# Patient Record
Sex: Male | Born: 1960
Health system: Southern US, Community
[De-identification: ages and names within clinical notes are randomized; demographics above are authoritative.]

## PROBLEM LIST (undated history)

## (undated) DIAGNOSIS — F419 Anxiety disorder, unspecified: Secondary | ICD-10-CM

## (undated) HISTORY — DX: Anxiety disorder, unspecified: F41.9

---

## 2007-06-27 ENCOUNTER — Ambulatory Visit: Payer: Self-pay | Admitting: Family Medicine

## 2007-06-27 DIAGNOSIS — F411 Generalized anxiety disorder: Secondary | ICD-10-CM | POA: Insufficient documentation

## 2007-06-27 DIAGNOSIS — J019 Acute sinusitis, unspecified: Secondary | ICD-10-CM

## 2007-06-27 LAB — CONVERTED CEMR LAB: Rapid Strep: NEGATIVE

## 2008-06-26 ENCOUNTER — Ambulatory Visit: Payer: Self-pay | Admitting: Family Medicine

## 2008-06-26 DIAGNOSIS — F329 Major depressive disorder, single episode, unspecified: Secondary | ICD-10-CM | POA: Insufficient documentation

## 2008-07-07 ENCOUNTER — Encounter (INDEPENDENT_AMBULATORY_CARE_PROVIDER_SITE_OTHER): Payer: Self-pay | Admitting: *Deleted

## 2008-07-16 ENCOUNTER — Ambulatory Visit: Payer: Self-pay | Admitting: Family Medicine

## 2008-10-06 ENCOUNTER — Telehealth (INDEPENDENT_AMBULATORY_CARE_PROVIDER_SITE_OTHER): Payer: Self-pay | Admitting: *Deleted

## 2008-10-06 ENCOUNTER — Ambulatory Visit: Payer: Self-pay | Admitting: Family Medicine

## 2008-10-06 ENCOUNTER — Encounter (INDEPENDENT_AMBULATORY_CARE_PROVIDER_SITE_OTHER): Payer: Self-pay | Admitting: *Deleted

## 2008-10-06 DIAGNOSIS — R42 Dizziness and giddiness: Secondary | ICD-10-CM

## 2008-10-09 ENCOUNTER — Telehealth (INDEPENDENT_AMBULATORY_CARE_PROVIDER_SITE_OTHER): Payer: Self-pay | Admitting: *Deleted

## 2012-11-14 ENCOUNTER — Ambulatory Visit (INDEPENDENT_AMBULATORY_CARE_PROVIDER_SITE_OTHER): Payer: Managed Care, Other (non HMO) | Admitting: Internal Medicine

## 2012-11-14 ENCOUNTER — Encounter: Payer: Self-pay | Admitting: Internal Medicine

## 2012-11-14 VITALS — BP 130/78 | HR 87 | Temp 98.1°F | Wt 194.0 lb

## 2012-11-14 DIAGNOSIS — S8990XA Unspecified injury of unspecified lower leg, initial encounter: Secondary | ICD-10-CM

## 2012-11-14 MED ORDER — ALPRAZOLAM 0.25 MG PO TABS: 0.2500 mg | ORAL_TABLET | Freq: Two times a day (BID) | ORAL | Status: DC | PRN

## 2012-11-14 NOTE — Progress Notes (Signed)
  Subjective:    Patient ID: Stephen Henry, male    DOB: 1960-11-24, 52 y.o.   MRN: [id number]  HPI The pain began 11/13/12 in L ankle with associated injury or trigger of misstepping off curb twisting l ankle.He felt a "crack" acutely. It is described as dull -sharp  up to level 7 in am. The pain does not radiates . The discomfort lasts constantly with ambulation. It is exacerbated by prolonged immobilization such as sleeping No associated of redness or  swelling . It is stiff w/o skin color change or  temperature change. The pain was not treated with ice or elevation.    He's under a great deal of stress related to impending divorce from his bride of  3 months.        Review of Systems Constitutional: no fever, chills, sweats Musculoskeletal:no  muscle cramps or pain Neuro: no weakness. Chronic , unrelated LLE numbness and tingling from back issues Heme:no lymphadenopathy; abnormal bruising or bleeding        Objective:   Physical Exam Gen.: Healthy and well-nourished in appearance. Alert, appropriate and cooperative throughout exam.  Musculoskeletal/extremities: There is visible swelling of the left lateral malleolus. There is no change in color or temperature of the overlying skin. There is point tenderness over the mid to lower left lateral malleolus. There is pain with inversion of the left ankle and extension of the foot. Vascular: Dorsalis pedis slightly decreased; posterior tibial pulses are full and equal. Para cost is greatest over the right ankle. Neurologic: Alert and oriented x3. Deep tendon reflexes symmetrical and normal.  Skin: Intact without suspicious lesions or rashes. Psych: Frustrated but normally interactive                                                                                       Assessment & Plan:

## 2012-11-14 NOTE — Patient Instructions (Addendum)
Review and correct the record as indicated. Please share record with all medical staff seen.  

## 2012-11-19 ENCOUNTER — Telehealth: Payer: Self-pay | Admitting: Family Medicine

## 2012-11-19 NOTE — Telephone Encounter (Signed)
Caller: Stephen Henry/Patient; Phone: [phone]; Reason for Call: Patient reports that the Xanax prescription he received is not working to help his anxiety.  He reports that he needs atleast 1mg  instead of 0.  25mg  that was prescribed.  Please call the patient to let him know what he needs to do in order to get a stronger dose of medication.

## 2012-11-19 NOTE — Telephone Encounter (Signed)
I called patient and informed him of MD (Dr.Paz's) response. Patient verbalized understanding  Hopp please advise, patient seen you for anxiety on 11/14/2012

## 2012-11-19 NOTE — Telephone Encounter (Signed)
Spoke with patient to inform him that Dr.Hopper whom he seen for anxiety is out of the office as well as his primary care doctor. Patient requested that I send this to another MD for review, patient states " I am going through a serious freaking divorce and I can not sleep at night" (see OV note dated 11/14/2012).   Please advise if ok to increase to 1 mg

## 2012-11-19 NOTE — Telephone Encounter (Signed)
Patient got 20 tablets 11/15/2011, enough medication for 10 days. I will defer prescription to PCP in the morning.

## 2012-11-20 MED ORDER — ALPRAZOLAM 0.5 MG PO TBDP: 0.5000 mg | ORAL_TABLET | Freq: Two times a day (BID) | ORAL | Status: DC | PRN

## 2012-11-20 NOTE — Telephone Encounter (Signed)
Patient is calling again, stating to please express to Dr. Alwyn Ren that how extremely stressed and freaked out he is due to divorce.  Patient would like xanax request address ASAP please.

## 2012-11-20 NOTE — Telephone Encounter (Signed)
Per Dr. Alwyn Ren, request is being forwarded to Dr. Beverely Low.

## 2012-11-20 NOTE — Telephone Encounter (Signed)
Spoke with the pt and informed him that Dr. Beverely Low stated that we can increase the Xanax to 0.5mg  BID prn.  We have not seen him  In 4 years and do not feel comfortable jumping up to 1mg  dose.  Informed him the it he's this stressed out he needs to see a psychiatrist to help him through this difficult time.  Pt stated that we be okay but it's a money and ins thing b/c he has a high ins deductible and he can't pay it.  Pt stated that increasing to the 0.5mg  will be good.  He stated that he normal take 1 when he's very stress and 1 at bedtime to help him sleep.  Informed the pt that I will call him when the rx is ready and he can come and pick it up.  Pt agreed.//AB/CMA

## 2012-11-20 NOTE — Telephone Encounter (Signed)
Pt can increase to 0.5 mg xanax BID PRN, #30, no refills.  I have not seen him in 4 yrs and do not feel comfortable jumping up to 1mg  dose.  If he's this stressed out he needs to see a psychiatrist to help him through this difficult time- please provide names and #s

## 2012-11-21 ENCOUNTER — Encounter: Payer: Self-pay | Admitting: Family Medicine

## 2012-11-21 ENCOUNTER — Ambulatory Visit (INDEPENDENT_AMBULATORY_CARE_PROVIDER_SITE_OTHER): Payer: Managed Care, Other (non HMO) | Admitting: Family Medicine

## 2012-11-21 VITALS — BP 148/100 | HR 85 | Temp 97.8°F | Ht 69.75 in | Wt 196.8 lb

## 2012-11-21 DIAGNOSIS — F329 Major depressive disorder, single episode, unspecified: Secondary | ICD-10-CM

## 2012-11-21 DIAGNOSIS — R030 Elevated blood-pressure reading, without diagnosis of hypertension: Secondary | ICD-10-CM

## 2012-11-21 DIAGNOSIS — F411 Generalized anxiety disorder: Secondary | ICD-10-CM

## 2012-11-21 MED ORDER — CITALOPRAM HYDROBROMIDE 20 MG PO TABS: 20.0000 mg | ORAL_TABLET | Freq: Every day | ORAL | Status: DC

## 2012-11-21 NOTE — Patient Instructions (Addendum)
Follow up in 1 month to recheck mood Start the Celexa daily Use the Xanax as needed for panicked moments Call and schedule counseling- this will help! Hang in there!!!

## 2012-11-21 NOTE — Progress Notes (Signed)
  Subjective:    Patient ID: Stephen Henry, male    DOB: 1961-04-19, 52 y.o.   MRN: [id number]  HPI Anxiety- got married 4 months ago 'and she decided to just leave'.  Wife is now w/ 'prominent attorney in [place]'.  Has PI following the situation.  'it's just overwhelming'.  Has been dating woman x3 yrs.  Pt now pursuing 'alienation of affection law suit'.  Had panic attack at work yesterday- 'i didn't know where i was, i was shaking, couldn't work'.  Pt is 'buried in debt'.  May have to declare bankruptcy and will lose home.  Pt not sleeping well, having hard time concentrating at work.  Decreased appetite.  Elevated BP- no hx of similar.  No CP, SOB, HAs, visual changes, edema.   Review of Systems For ROS see HPI     Objective:   Physical Exam  Vitals reviewed. Constitutional: He is oriented to person, place, and time. He appears well-developed and well-nourished. He appears distressed (obviously upset).  HENT:  Head: Normocephalic and atraumatic.  Eyes: Conjunctivae and EOM are normal. Pupils are equal, round, and reactive to light.  Neck: Normal range of motion. Neck supple. No thyromegaly present.  Cardiovascular: Normal rate, regular rhythm, normal heart sounds and intact distal pulses.   No murmur heard. Pulmonary/Chest: Effort normal and breath sounds normal. No respiratory distress.  Abdominal: Soft. Bowel sounds are normal. He exhibits no distension.  Musculoskeletal: He exhibits no edema.  Lymphadenopathy:    He has no cervical adenopathy.  Neurological: He is alert and oriented to person, place, and time. No cranial nerve deficit.  Skin: Skin is warm and dry.  Psychiatric:  Anxious, depressed, fighting tears          Assessment & Plan:

## 2012-11-21 NOTE — Assessment & Plan Note (Signed)
New.  No hx of similar.  Would guess this is stress related- pt is very worked up about current situation.  Asymptomatic.  Will follow closely and start meds at future visits if needed.

## 2012-11-21 NOTE — Assessment & Plan Note (Signed)
Deteriorated.  Start xanax as needed.  Daily controller SSRI.  Will follow.

## 2012-11-21 NOTE — Assessment & Plan Note (Signed)
Deteriorated.  Will start daily controller med.  Highly recommended counseling and/or psych.  Pt agreeable.  Will follow.

## 2012-11-25 ENCOUNTER — Telehealth: Payer: Self-pay | Admitting: Family Medicine

## 2012-11-25 DIAGNOSIS — F411 Generalized anxiety disorder: Secondary | ICD-10-CM

## 2012-11-25 DIAGNOSIS — F329 Major depressive disorder, single episode, unspecified: Secondary | ICD-10-CM

## 2012-11-25 MED ORDER — ALPRAZOLAM 0.5 MG PO TBDP: 0.5000 mg | ORAL_TABLET | Freq: Three times a day (TID) | ORAL | Status: DC | PRN

## 2012-11-25 MED ORDER — CITALOPRAM HYDROBROMIDE 20 MG PO TABS: 40.0000 mg | ORAL_TABLET | Freq: Every day | ORAL | Status: DC

## 2012-11-25 NOTE — Telephone Encounter (Signed)
Patient called stating the medication he was given last week is not working and he thinks he needs a higher dosage. CB# [phone]

## 2012-11-25 NOTE — Telephone Encounter (Signed)
Spoke with pt made him aware that Dr. Beverely Low has agreed to give him an additional 60 tablets of xanax to help him get through this rough spot in his life. I strongly advised him to not take more than 3 tablets in a 24 hour time frame as we would not supply any additional meds. I again encouraged pt to continue trying to get appt w/ psych to discuss this situation. Pt verbalized understanding and agreed to plan.

## 2012-11-25 NOTE — Telephone Encounter (Signed)
If pt is already taking 4 pills daily I am not going to increase the dose for fear that he will OD as he has been adjusting his meds as he sees fit.  He can have #60 additional xanax to account for the extra he is taking but I will not change the dose.  I ok'd the increase in the Celexa- this will take a few weeks to reach max efficacy.  He needs to give this some time.  There is no magic wand or pill that can take his stress and anger away and all of this will take time to get better.

## 2012-11-25 NOTE — Telephone Encounter (Signed)
Patient presented to the office very irritated and agitated demanding to know if we were going to increase his depression medication and Xanax. Pt states he has been taking 4 pills daily not the 2 pills daily that was prescribed of the Xanax. MD has increased dose of depression med and this was relayed to the pt when he came in the office. I asked the pt if he called the psych md  As we requested, he stated "they didn't have any freaking appts until May, by then all this will be over." I advised the patient that the MD would have to review the notes and only she could make the determination to increase the dose of the Xanax if she felt it was appropriate. I informed the pt that a staff member would call him back but it would probably be at the end of the day.

## 2012-11-25 NOTE — Telephone Encounter (Signed)
Increase to 40mg  daily- 2 of what he has, please send new script for 40mg  daily, #30, 3 refills

## 2012-12-23 ENCOUNTER — Encounter: Payer: Self-pay | Admitting: Lab

## 2012-12-23 ENCOUNTER — Encounter: Payer: Self-pay | Admitting: Family Medicine

## 2012-12-23 ENCOUNTER — Ambulatory Visit (INDEPENDENT_AMBULATORY_CARE_PROVIDER_SITE_OTHER): Payer: Managed Care, Other (non HMO) | Admitting: Family Medicine

## 2012-12-23 VITALS — BP 130/90 | HR 82 | Temp 98.9°F | Ht 69.75 in | Wt 185.8 lb

## 2012-12-23 DIAGNOSIS — F411 Generalized anxiety disorder: Secondary | ICD-10-CM

## 2012-12-23 MED ORDER — ALPRAZOLAM 0.5 MG PO TABS: 0.5000 mg | ORAL_TABLET | Freq: Three times a day (TID) | ORAL | Status: DC | PRN

## 2012-12-23 MED ORDER — BUSPIRONE HCL 15 MG PO TABS: ORAL_TABLET | ORAL | Status: DC

## 2012-12-23 NOTE — Assessment & Plan Note (Signed)
Severe but unchanged.  No obvious improvement on SSRI.  Is able to sleep when taking alprazolam.  Add Buspar to Celexa and continue xanax prn.  Again encouraged psych but pt declines.  Will follow.

## 2012-12-23 NOTE — Progress Notes (Signed)
  Subjective:    Patient ID: Stephen Henry, male    DOB: 1960-09-07, 52 y.o.   MRN: [id number]  HPI Anxiety/depression- pt is currently in the midst of alienation of affection lawsuit.  Estranged wife filed 'false assault charges' which required 48 hr mandatory jail stay.  Charges were later dismissed.  Pt was also accused of check fraud over [employer].  Has conference call later today to discuss situation.  Will have 'waves' of anxiety- typically worse in AM and PM.  On Celexa 40mg  daily, using Alprazolam prn.  Did not schedule psych appt as directed- 'i don't have the money'.   Review of Systems For ROS see HPI     Objective:   Physical Exam  Vitals reviewed. Constitutional: He is oriented to person, place, and time. He appears well-developed and well-nourished. No distress.  Neurological: He is alert and oriented to person, place, and time.  Skin: Skin is warm and dry.  Psychiatric: His behavior is normal. Judgment and thought content normal.  Angry, anxious          Assessment & Plan:

## 2012-12-23 NOTE — Patient Instructions (Addendum)
Follow up in 1 month Continue the Celexa 40mg  Add the Buspirone twice daily Continue the xanax as needed Call with any questions or concerns Hang in there!

## 2013-01-24 ENCOUNTER — Encounter: Payer: Self-pay | Admitting: Lab

## 2013-01-27 ENCOUNTER — Ambulatory Visit (INDEPENDENT_AMBULATORY_CARE_PROVIDER_SITE_OTHER): Payer: Managed Care, Other (non HMO) | Admitting: Family Medicine

## 2013-01-27 ENCOUNTER — Encounter: Payer: Self-pay | Admitting: Family Medicine

## 2013-01-27 VITALS — BP 130/100 | HR 85 | Temp 98.5°F | Ht 69.75 in | Wt 187.0 lb

## 2013-01-27 DIAGNOSIS — R55 Syncope and collapse: Secondary | ICD-10-CM

## 2013-01-27 DIAGNOSIS — F411 Generalized anxiety disorder: Secondary | ICD-10-CM

## 2013-01-27 MED ORDER — ALPRAZOLAM 0.5 MG PO TABS: 0.5000 mg | ORAL_TABLET | Freq: Three times a day (TID) | ORAL | Status: DC | PRN

## 2013-01-27 MED ORDER — ALPRAZOLAM 1 MG PO TABS: 1.0000 mg | ORAL_TABLET | Freq: Three times a day (TID) | ORAL | Status: DC | PRN

## 2013-01-27 NOTE — Patient Instructions (Addendum)
STOP the Buspar Continue the celexa daily and the xanax as needed If you have another episode of blacking out- call me or go to the ER Call with any questions or concerns Hang in there!!!

## 2013-01-27 NOTE — Progress Notes (Signed)
  Subjective:    Patient ID: Stephen Henry, male    DOB: 01/04/1961, 52 y.o.   MRN: [id number]  HPI Anxiety- pt was started on Buspar at last OV.  Feels medication is 'too strong'.  'those things were putting me to sleep'.  Has weaned down to 1/4 tab in attempts of getting off med.  Syncope- pt reports both events occurred after physical exertion and while very emotionally stressed.  No hx of similar.  1st episode 6-7 weeks ago.  Again happened Thursday night.  Both times woke up after hitting face.  No preceding feeling of dizziness, vision blacking out.  No CP, SOB, HAs, visual changes.   Review of Systems For ROS see HPI     Objective:   Physical Exam  Vitals reviewed. Constitutional: He is oriented to person, place, and time. He appears well-developed and well-nourished. No distress.  HENT:  Head: Normocephalic and atraumatic.  Eyes: Conjunctivae and EOM are normal. Pupils are equal, round, and reactive to light.  Neck: Normal range of motion. Neck supple. No thyromegaly present.  Cardiovascular: Normal rate, regular rhythm, normal heart sounds and intact distal pulses.   No murmur heard. Pulmonary/Chest: Effort normal and breath sounds normal. No respiratory distress.  Abdominal: Soft. Bowel sounds are normal. He exhibits no distension.  Musculoskeletal: He exhibits no edema.  Lymphadenopathy:    He has no cervical adenopathy.  Neurological: He is alert and oriented to person, place, and time. No cranial nerve deficit.  Skin: Skin is warm and dry.  Psychiatric:  Anxious, irritable, angry at times          Assessment & Plan:

## 2013-01-28 DIAGNOSIS — R55 Syncope and collapse: Secondary | ICD-10-CM | POA: Insufficient documentation

## 2013-01-28 NOTE — Assessment & Plan Note (Signed)
Unchanged.  Pt still not doing well but now intolerant to Buspar.  Will continue Celexa and increase alprazolam.  Again, encouraged counseling.  Pt again refused.  Will follow.

## 2013-01-28 NOTE — Assessment & Plan Note (Signed)
New.  Pt refusing labs and EKG.  Refusing all work up- 'my deductible's too high and i can't afford all that'.  Pt attributing his 2 episodes to use of buspar.  Stop med.  If sxs continue, pt to call or go to ER b/c he will then require a work up.  Pt expressed understanding and is in agreement w/ plan.

## 2013-02-20 ENCOUNTER — Telehealth: Payer: Self-pay | Admitting: Family Medicine

## 2013-02-20 DIAGNOSIS — F329 Major depressive disorder, single episode, unspecified: Secondary | ICD-10-CM

## 2013-02-20 MED ORDER — CITALOPRAM HYDROBROMIDE 20 MG PO TABS: 60.0000 mg | ORAL_TABLET | Freq: Every day | ORAL | Status: DC

## 2013-02-20 NOTE — Telephone Encounter (Signed)
Ok for him to take 3 tabs daily but this is the max dose.  He will likely need his prescription changed to indicate the dose change and increased quantity

## 2013-02-20 NOTE — Telephone Encounter (Signed)
Patient Information:  Caller Name: Bard  Phone: [phone]  Patient: Stephen Henry, Stephen Henry  Gender: Male  DOB: 23-Jan-1961  Age: 52 Years  PCP: Sheliah Hatch.  Office Follow Up:  Does the office need to follow up with this patient?: Yes  Instructions For The Office: Please see symptoms regarding pt's sxs and increase in medication and follow up with pt   Symptoms  Reason For Call & Symptoms: Pt is on Celexa 20mg  2 tabs once a day.  Depression isn't better, feeling sad lately.  Pt increased Celexa to 3 tabs once a day on 02/19/13 and wants to make sure this is ok or other recommendations.  Reviewed Health History In EMR: Yes  Reviewed Medications In EMR: Yes  Reviewed Allergies In EMR: Yes  Reviewed Surgeries / Procedures: Yes  Date of Onset of Symptoms: 02/19/2013  Guideline(s) Used:  Depression  Disposition Per Guideline:   Discuss with PCP and Callback by Nurse Today  Reason For Disposition Reached:   Started on anti-depressant medications > 2 weeks ago and not feeling any better  Advice Given:  Call Back If:  You feel like harming yourself  You become worse.  Patient Will Follow Care Advice:  YES

## 2013-02-20 NOTE — Telephone Encounter (Signed)
Discuss with patient, Rx sent. 

## 2013-03-19 ENCOUNTER — Ambulatory Visit: Payer: Managed Care, Other (non HMO) | Admitting: Internal Medicine

## 2013-03-19 ENCOUNTER — Encounter: Payer: Self-pay | Admitting: *Deleted

## 2013-03-20 ENCOUNTER — Ambulatory Visit: Payer: Managed Care, Other (non HMO) | Admitting: Family Medicine

## 2013-03-20 ENCOUNTER — Ambulatory Visit (INDEPENDENT_AMBULATORY_CARE_PROVIDER_SITE_OTHER): Payer: Managed Care, Other (non HMO) | Admitting: Family Medicine

## 2013-03-20 ENCOUNTER — Encounter: Payer: Self-pay | Admitting: Family Medicine

## 2013-03-20 VITALS — BP 138/80 | HR 79 | Temp 98.2°F | Ht 69.75 in | Wt 191.6 lb

## 2013-03-20 DIAGNOSIS — N529 Male erectile dysfunction, unspecified: Secondary | ICD-10-CM

## 2013-03-20 DIAGNOSIS — F411 Generalized anxiety disorder: Secondary | ICD-10-CM

## 2013-03-20 MED ORDER — SILDENAFIL CITRATE 50 MG PO TABS: 50.0000 mg | ORAL_TABLET | Freq: Every day | ORAL | Status: DC | PRN

## 2013-03-20 MED ORDER — ALPRAZOLAM 1 MG PO TABS: 1.0000 mg | ORAL_TABLET | Freq: Three times a day (TID) | ORAL | Status: DC | PRN

## 2013-03-20 NOTE — Progress Notes (Signed)
  Subjective:    Patient ID: Stephen Henry, male    DOB: [birth year], 52 y.o.   MRN: [id number]  HPI Anxiety/depression- needs refill on xanax.  Had trial this AM and has another on the 29th for divorce.  Has reported doing better recently until this round of court cases.  Ex-wife has now filed charges against currently separated wife- ex wife lost case and is now made at pt and 'doesn't ever want to speak to him again'.  Has fired Facilities manager and only received 1/3 of money he paid.  Stressed about this.  Feels that the meds are definitely working but causing some sexual side effects and would like to try Viagra.   Review of Systems For ROS see HPI     Objective:   Physical Exam  Vitals reviewed. Constitutional: He is oriented to person, place, and time. He appears well-developed and well-nourished. No distress.  Neurological: He is alert and oriented to person, place, and time.  Skin: Skin is warm and dry.  Psychiatric: His behavior is normal. Judgment and thought content normal.  Most calm that he has been in recent months          Assessment & Plan:

## 2013-03-20 NOTE — Assessment & Plan Note (Signed)
New.  Suspect this is due to medication side effect from Celexa.  Start Viagra and see if sxs improve.  Pt expressed understanding and is in agreement w/ plan.

## 2013-03-20 NOTE — Assessment & Plan Note (Signed)
Unchanged.  Requires refill on xanax.  Depression/anxiety is actually better than previous.  Will continue to follow.

## 2013-03-20 NOTE — Patient Instructions (Addendum)
Continue the Celexa daily Use the alprazolam as needed for anxiety Keep hanging in there!!!

## 2013-05-14 ENCOUNTER — Telehealth: Payer: Self-pay | Admitting: Family Medicine

## 2013-05-14 NOTE — Telephone Encounter (Signed)
Patient called requesting rx for alprazolam be sent to [facility]. Patient was advised to contact pharmacy for future requests.

## 2013-05-16 NOTE — Telephone Encounter (Signed)
Last OV 03/20/13 Last refill 03/20/13 Contact on file No UDS Please advise.

## 2013-05-19 MED ORDER — ALPRAZOLAM 1 MG PO TABS: 1.0000 mg | ORAL_TABLET | Freq: Three times a day (TID) | ORAL | Status: DC | PRN

## 2013-05-19 NOTE — Telephone Encounter (Signed)
Rx given to [facility] to sign, pt will be notified to pick up rx (needs UDS)

## 2013-05-19 NOTE — Telephone Encounter (Signed)
Ok for #60.  needs to sign agreement and do UDS if not already done

## 2013-06-17 ENCOUNTER — Telehealth: Payer: Self-pay | Admitting: Family Medicine

## 2013-06-17 MED ORDER — TRAZODONE HCL 50 MG PO TABS: 50.0000 mg | ORAL_TABLET | Freq: Every evening | ORAL | Status: DC | PRN

## 2013-06-17 MED ORDER — BUSPIRONE HCL 15 MG PO TABS: 7.5000 mg | ORAL_TABLET | Freq: Two times a day (BID) | ORAL | Status: DC

## 2013-06-17 NOTE — Telephone Encounter (Signed)
Spoke with pt and he would like to know if there is an alternative to the Xanax that is not a controlled substance. States he had been on a "blue pill" in the past that had helped him sleep and controlled his anxiety. Please advise pt states he has not slept in 3 days.

## 2013-06-17 NOTE — Telephone Encounter (Signed)
Per Tabori sent in Buspar 15mg  take 1/2 bid for two weeks and then take 1 tablet by mouth bid. Also sent in rx for trazodone to help pt sleep.

## 2013-06-17 NOTE — Telephone Encounter (Signed)
Pt signed controlled substance agreement and that clearly states he is subject to be tested b/c he is on benzos.  If he is not interested in doing UDS, no additional med refills

## 2013-06-17 NOTE — Telephone Encounter (Signed)
Patient states that he tried picking up anxiety medication in early October but was told that he needed to do a drug screening. He states that he refused to do drug screening and is now out of medication. He would like it if someone would explain to him why exactly he needs to do a drug test?

## 2013-06-17 NOTE — Telephone Encounter (Signed)
Please advise 

## 2013-06-18 NOTE — Telephone Encounter (Signed)
Agree w/ note as documented by CMA- scripts for Trazodone and Buspar sent

## 2013-06-24 ENCOUNTER — Telehealth: Payer: Self-pay | Admitting: Family Medicine

## 2013-06-24 NOTE — Telephone Encounter (Signed)
Ok for xanax refill if pt agrees to do UDS

## 2013-06-24 NOTE — Telephone Encounter (Signed)
Patient states that he has not slept in 4 days and would like to start back using Xanax. Advised patient that he was given an alternative because he refused to do UDS (per last phone note). Patient states that he will come in for UDS if he can pick up Xanax rx because he is unable to function. Please advise.

## 2013-06-25 MED ORDER — ALPRAZOLAM 1 MG PO TABS: 1.0000 mg | ORAL_TABLET | Freq: Three times a day (TID) | ORAL | Status: DC | PRN

## 2013-06-25 NOTE — Telephone Encounter (Signed)
Called pt could not leave a message. Placed up front for pt to complete uds.

## 2013-06-25 NOTE — Telephone Encounter (Signed)
Med filled, placed up front for pick up pt notified.

## 2013-06-27 ENCOUNTER — Ambulatory Visit (INDEPENDENT_AMBULATORY_CARE_PROVIDER_SITE_OTHER): Payer: Managed Care, Other (non HMO) | Admitting: Family Medicine

## 2013-06-27 ENCOUNTER — Encounter: Payer: Self-pay | Admitting: Family Medicine

## 2013-06-27 VITALS — BP 130/86 | HR 81 | Temp 98.5°F | Resp 16 | Wt 189.5 lb

## 2013-06-27 DIAGNOSIS — J069 Acute upper respiratory infection, unspecified: Secondary | ICD-10-CM

## 2013-06-27 MED ORDER — IBUPROFEN 600 MG PO TABS: 600.0000 mg | ORAL_TABLET | Freq: Three times a day (TID) | ORAL | Status: AC | PRN

## 2013-06-27 NOTE — Patient Instructions (Signed)
Follow up as needed Start the ibuprofen 3x/day- take w/ food for fever/inflammation of vocal cords Drink plenty of fluids REST! Start Mucinex D for congestion and drainage If no improvement by next week, or worsening, call for antibiotic Add tylenol as needed for body aches/fever Hang in there!!

## 2013-06-27 NOTE — Assessment & Plan Note (Signed)
New.  No evidence of bacterial infxn.  No need for abx.  Reviewed supportive care and red flags that should prompt return.  Pt expressed understanding and is in agreement w/ plan.  

## 2013-06-27 NOTE — Progress Notes (Signed)
  Subjective:    Patient ID: Stephen Henry, male    DOB: 1961/01/21, 52 y.o.   MRN: [id number]  HPI URI- sxs started 'a couple of days ago' but 'yesterday it got really bad'.  + sinus pain, nasal congestion, PND.  Bilateral ear fullness.  + hoarseness- has completely lost voice.  + dry cough.  + subjective fevers.  No N/V, + diarrhea.  No known sick contacts   Review of Systems For ROS see HPI     Objective:   Physical Exam  Vitals reviewed. Constitutional: He appears well-developed and well-nourished. No distress.  HENT:  Head: Normocephalic and atraumatic.  Right Ear: Tympanic membrane normal.  Left Ear: Tympanic membrane normal.  Nose: Mucosal edema and rhinorrhea present. Right sinus exhibits no maxillary sinus tenderness and no frontal sinus tenderness. Left sinus exhibits no maxillary sinus tenderness and no frontal sinus tenderness.  Mouth/Throat: Mucous membranes are normal. Oropharyngeal exudate (PND) and posterior oropharyngeal erythema present. No posterior oropharyngeal edema.  hoarseness  Eyes: Conjunctivae and EOM are normal. Pupils are equal, round, and reactive to light.  Neck: Normal range of motion. Neck supple.  Cardiovascular: Normal rate, regular rhythm and normal heart sounds.   Pulmonary/Chest: Effort normal and breath sounds normal. No respiratory distress. He has no wheezes.  + cough  Lymphadenopathy:    He has no cervical adenopathy.  Skin: Skin is warm and dry.          Assessment & Plan:

## 2013-08-07 ENCOUNTER — Encounter: Payer: Self-pay | Admitting: Family Medicine

## 2013-08-15 ENCOUNTER — Telehealth: Payer: Self-pay | Admitting: Family Medicine

## 2013-08-15 MED ORDER — ALPRAZOLAM 1 MG PO TABS: 1.0000 mg | ORAL_TABLET | Freq: Three times a day (TID) | ORAL | Status: DC | PRN

## 2013-08-15 NOTE — Telephone Encounter (Signed)
Patient is calling to request a refill on his Xanax rx. He is completely out. Please advise.

## 2013-08-15 NOTE — Telephone Encounter (Signed)
Ok for refill? 

## 2013-08-15 NOTE — Telephone Encounter (Signed)
Med filled.  

## 2013-08-15 NOTE — Telephone Encounter (Signed)
Pt.notified

## 2013-08-15 NOTE — Telephone Encounter (Signed)
Last OV 06-27-13 (URI) Xanax filled 06-25-13 #60 with 0  Low Risk

## 2013-10-27 ENCOUNTER — Telehealth: Payer: Self-pay | Admitting: Family Medicine

## 2013-10-27 MED ORDER — ALPRAZOLAM 1 MG PO TABS: 1.0000 mg | ORAL_TABLET | Freq: Three times a day (TID) | ORAL | Status: DC | PRN

## 2013-10-27 NOTE — Telephone Encounter (Signed)
Med filled.  

## 2013-10-27 NOTE — Telephone Encounter (Signed)
Ok for #60, 1 refill 

## 2013-10-27 NOTE — Telephone Encounter (Signed)
Patient called requesting rx for alprazolam. [facility] [address]

## 2013-10-27 NOTE — Telephone Encounter (Signed)
Last OV 06-27-13 Xanax filled 08-15-13 #60 with 0

## 2014-01-06 ENCOUNTER — Telehealth: Payer: Self-pay | Admitting: Family Medicine

## 2014-01-06 MED ORDER — ALPRAZOLAM 1 MG PO TABS: 1.0000 mg | ORAL_TABLET | Freq: Three times a day (TID) | ORAL | Status: DC | PRN

## 2014-01-06 NOTE — Telephone Encounter (Signed)
Caller name: Molly MaduroRobert Relation to pt: self Call back number: [phone] [id number] [place], [place] - [address]  Reason for call:  Pt needs a refill on RX ALPRAZolam (XANAX) 1 MG tablet .  Pt would like to be contacted when sent to pharmacy.

## 2014-01-06 NOTE — Telephone Encounter (Signed)
Rx printed and placed in Dr. Rennis Goldenabori's red quick signature folder awaiting signature.

## 2014-01-06 NOTE — Telephone Encounter (Signed)
Last OV 09-10-13 Alprazolam last filled 10-27-13 #60 with 1  Low risk

## 2014-01-06 NOTE — Telephone Encounter (Signed)
Ok for #60, 1 refill 

## 2014-01-08 NOTE — Telephone Encounter (Signed)
[facility] [address] by Lorene DyJessica Tyler, CMA.

## 2014-02-10 ENCOUNTER — Encounter: Payer: Self-pay | Admitting: Family Medicine

## 2014-02-10 ENCOUNTER — Ambulatory Visit (INDEPENDENT_AMBULATORY_CARE_PROVIDER_SITE_OTHER): Payer: Managed Care, Other (non HMO) | Admitting: Family Medicine

## 2014-02-10 VITALS — BP 120/68 | HR 86 | Temp 98.0°F | Resp 16 | Wt 192.4 lb

## 2014-02-10 DIAGNOSIS — H8110 Benign paroxysmal vertigo, unspecified ear: Secondary | ICD-10-CM

## 2014-02-10 MED ORDER — MECLIZINE HCL 50 MG PO TABS: 50.0000 mg | ORAL_TABLET | Freq: Three times a day (TID) | ORAL | Status: AC | PRN

## 2014-02-10 NOTE — Assessment & Plan Note (Signed)
New.  Pt is completely asymptomatic today.  Has had 2 episodes- most recent was yesterday w/ turning head to look out window.  Reviewed dx and tx w/ pt- script for meclizine given.  Reviewed supportive care and red flags that should prompt return.  Pt expressed understanding and is in agreement w/ plan.

## 2014-02-10 NOTE — Progress Notes (Signed)
Pre visit review using our clinic review tool, if applicable. No additional management support is needed unless otherwise documented below in the visit note. 
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
## 2014-02-10 NOTE — Patient Instructions (Signed)
Schedule your complete physical at your convenience Use the Meclizine as needed for dizziness Drink plenty of fluids Change positions slowly Call with any questions or concerns Hang in there!!

## 2014-06-10 ENCOUNTER — Other Ambulatory Visit: Payer: Self-pay

## 2014-06-10 MED ORDER — ALPRAZOLAM 1 MG PO TABS: 1.0000 mg | ORAL_TABLET | Freq: Three times a day (TID) | ORAL | Status: DC | PRN

## 2014-06-11 ENCOUNTER — Other Ambulatory Visit: Payer: Self-pay | Admitting: General Practice

## 2014-06-11 MED ORDER — ALPRAZOLAM 1 MG PO TABS: 1.0000 mg | ORAL_TABLET | Freq: Three times a day (TID) | ORAL | Status: DC | PRN

## 2014-06-11 NOTE — Telephone Encounter (Signed)
Last OV 02-10-14 Alprazolam filled 01-06-14 #60 with 1

## 2014-06-11 NOTE — Telephone Encounter (Signed)
Med filled and faxed.  

## 2014-09-29 ENCOUNTER — Telehealth: Payer: Self-pay | Admitting: Family Medicine

## 2014-09-29 MED ORDER — ALPRAZOLAM 1 MG PO TABS: 1.0000 mg | ORAL_TABLET | Freq: Three times a day (TID) | ORAL | Status: AC | PRN

## 2014-09-29 NOTE — Telephone Encounter (Signed)
Caller name: Molly MaduroRobert Relation to pt: self Call back number: [phone] Pharmacy: [facility] [address]  Reason for call:   Patient states that his anxiety level has increased, having trouble sleeping. Denies suicidal thoughts. Patient did scheduled appointment with Dr. Beverely Lowabori on 10/05/14 but would like to know if Dr. Beverely Lowabori will fill another xanax rx to last him until then.

## 2014-09-29 NOTE — Telephone Encounter (Signed)
Ok for #30 

## 2014-09-29 NOTE — Telephone Encounter (Signed)
Med filled.  

## 2014-10-05 ENCOUNTER — Ambulatory Visit: Payer: Managed Care, Other (non HMO) | Admitting: Family Medicine

## 2014-10-05 ENCOUNTER — Telehealth: Payer: Self-pay | Admitting: Family Medicine

## 2014-10-05 NOTE — Telephone Encounter (Signed)
Needs fee

## 2014-10-05 NOTE — Telephone Encounter (Signed)
Yes, he needs a NS fee.

## 2014-10-05 NOTE — Telephone Encounter (Signed)
Pt had appt on 10/05/17 acute anxiety, pt no-showed, do we charge no show fee?

## 2014-11-30 ENCOUNTER — Encounter (HOSPITAL_COMMUNITY): Payer: Self-pay | Admitting: Emergency Medicine

## 2014-11-30 ENCOUNTER — Emergency Department (HOSPITAL_COMMUNITY)
Admission: EM | Admit: 2014-11-30 | Discharge: 2014-11-30 | Disposition: A | Discharge status: Home / Self Care | Payer: Managed Care, Other (non HMO) | Attending: Emergency Medicine | Admitting: Emergency Medicine

## 2014-11-30 ENCOUNTER — Emergency Department (HOSPITAL_COMMUNITY): Payer: Managed Care, Other (non HMO)

## 2014-11-30 DIAGNOSIS — Y998 Other external cause status: Secondary | ICD-10-CM | POA: Insufficient documentation

## 2014-11-30 DIAGNOSIS — S93401A Sprain of unspecified ligament of right ankle, initial encounter: Secondary | ICD-10-CM | POA: Insufficient documentation

## 2014-11-30 DIAGNOSIS — Y9289 Other specified places as the place of occurrence of the external cause: Secondary | ICD-10-CM | POA: Insufficient documentation

## 2014-11-30 DIAGNOSIS — Y9301 Activity, walking, marching and hiking: Secondary | ICD-10-CM | POA: Insufficient documentation

## 2014-11-30 DIAGNOSIS — F419 Anxiety disorder, unspecified: Secondary | ICD-10-CM | POA: Insufficient documentation

## 2014-11-30 DIAGNOSIS — X58XXXA Exposure to other specified factors, initial encounter: Secondary | ICD-10-CM | POA: Insufficient documentation

## 2014-11-30 MED ORDER — HYDROCODONE-ACETAMINOPHEN 5-325 MG PO TABS: 1.0000 | ORAL_TABLET | Freq: Four times a day (QID) | ORAL | Status: AC | PRN

## 2014-11-30 NOTE — ED Notes (Signed)
Patient returned from XRay

## 2014-11-30 NOTE — Progress Notes (Signed)
Orthopedic Tech Progress Note Patient Details:  Stephen FantasiaRobert Henry [birth year] [id number]  Ortho Devices Type of Ortho Device: Crutches Ortho Device/Splint Interventions: Application   Stephen Henry 11/30/2014, 11:57 AM

## 2014-11-30 NOTE — ED Notes (Signed)
Declined W/C at D/C and was escorted to lobby by RN. 

## 2014-11-30 NOTE — ED Provider Notes (Signed)
CSN: [id number]     Arrival date & time 11/30/14  1003 History  This chart was scribed for non-physician practitioner, Teressa Lower, NP, working with Linwood Dibbles, MD, by Bronson Curb, ED Scribe. This patient was seen in room TR09C/TR09C and the patient's care was started at 10:18 AM.   Chief Complaint  Patient presents with  . Ankle Injury    The history is provided by the patient. No language interpreter was used.     HPI Comments: Stephen Henry is a 54 y.o. male who presents to the Emergency Department complaining of a right ankle injury that occurred last night. Patient states he "rolled" his ankle last night walking in the woods. There is associated mild-moderate pain and swelling that has gotten progressively worse this morning. He reports he was weight bearing upon initial onset, but notes pain with ambulation and is unable to bear weight this morning. Patient reports history of the same, stating he frequently "rolls" his right ankle, but is typically able to "catch" himself. He has tried applying ice without significant relief. He denies any other injuries.    Past Medical History  Diagnosis Date  . Anxiety    History reviewed. No pertinent past surgical history. No family history on file. History  Substance Use Topics  . Smoking status: Never Smoker   . Smokeless tobacco: Not on file  . Alcohol Use: Yes    Review of Systems  Constitutional: Negative for fever.  Musculoskeletal: Positive for joint swelling and arthralgias.  Skin: Negative for wound.      Allergies  Review of patient's allergies indicates no known allergies.  Home Medications   Prior to Admission medications   Medication Sig Start Date End Date Taking? Authorizing Provider  ALPRAZolam Prudy Feeler) 1 MG tablet Take 1 tablet (1 mg total) by mouth 3 (three) times daily as needed for anxiety. 09/29/14   Sheliah Hatch, MD  ibuprofen (ADVIL,MOTRIN) 600 MG tablet Take 1 tablet (600 mg total) by mouth  every 8 (eight) hours as needed for pain. 06/27/13   Sheliah Hatch, MD  meclizine (ANTIVERT) 50 MG tablet Take 1 tablet (50 mg total) by mouth 3 (three) times daily as needed. 02/10/14   Sheliah Hatch, MD   Triage Vitals: BP 132/83 mmHg  Pulse 64  Temp(Src) 97.4 F (36.3 C)  Resp 18  Ht  (1.778 m)  Wt 192 lb (87.091 kg)  BMI 27.55 kg/m2  SpO2 100%  Physical Exam  Constitutional: He is oriented to person, place, and time. He appears well-developed and well-nourished. No distress.  HENT:  Head: Normocephalic and atraumatic.  Eyes: Conjunctivae and EOM are normal.  Neck: Neck supple. No tracheal deviation present.  Cardiovascular: Normal rate.   Pulmonary/Chest: Effort normal. No respiratory distress.  Musculoskeletal: Normal range of motion. He exhibits edema.  Obvious swelling noted to the lateral aspect of the right ankle. Pulses intact. Good flexion and extension of the foot.  Neurological: He is alert and oriented to person, place, and time.  Skin: Skin is warm and dry.  Psychiatric: He has a normal mood and affect. His behavior is normal.  Nursing note and vitals reviewed.   ED Course  Procedures (including critical care time)  DIAGNOSTIC STUDIES: Oxygen Saturation is 100% on room air, normal by my interpretation.    COORDINATION OF CARE: At 1022 Discussed treatment plan with patient which includes imaging. Patient agrees.   Labs Review Labs Reviewed - No data to display  Imaging Review  Dg Ankle Complete Right  11/30/2014   CLINICAL DATA:  Swelling, pain, right ankle injury  EXAM: RIGHT ANKLE - COMPLETE 3+ VIEW  COMPARISON:  None.  FINDINGS: There is no evidence of fracture, dislocation, or joint effusion. Mild marginal osteophyte along the anterior lip of the tibia. There is a small ankle joint effusion. There is a plantar calcaneal spur. There is soft tissue swelling over the lateral malleolus.  IMPRESSION: No acute osseous injury of the right ankle.  Severe soft tissue swelling over the lateral malleolus.   Electronically Signed   By: Elige KoHetal  Patel   On: 11/30/2014 11:23     EKG Interpretation None      MDM   Final diagnoses:  Ankle sprain, right, initial encounter    Likely bad sprain. Discussed with pt that cam walker would be best. Pt refusing. Pt given ortho referral and hydrocodone for pain  I personally performed the services described in this documentation, which was scribed in my presence. The recorded information has been reviewed and is accurate.    Teressa LowerVrinda Ammanda Dobbins, NP 11/30/14 1137  Linwood DibblesJon Knapp, MD 12/01/14 [phone]

## 2014-11-30 NOTE — Discharge Instructions (Signed)

## 2014-11-30 NOTE — ED Notes (Signed)
Pt does not want Stephen Henry, 'It's too big". PA notified.

## 2014-11-30 NOTE — ED Notes (Signed)
Pt "rolled" right ankle last pm. Now has pain and swelling to lateral ankle.

## 2016-12-02 IMAGING — CR DG ANKLE COMPLETE 3+V*R*
3 series · 3 of 3 positions shown · non-contrast
Comparison: None.

CLINICAL DATA: Swelling, pain, right ankle injury

EXAM:
RIGHT ANKLE - COMPLETE 3+ VIEW

[x ankle ap right]
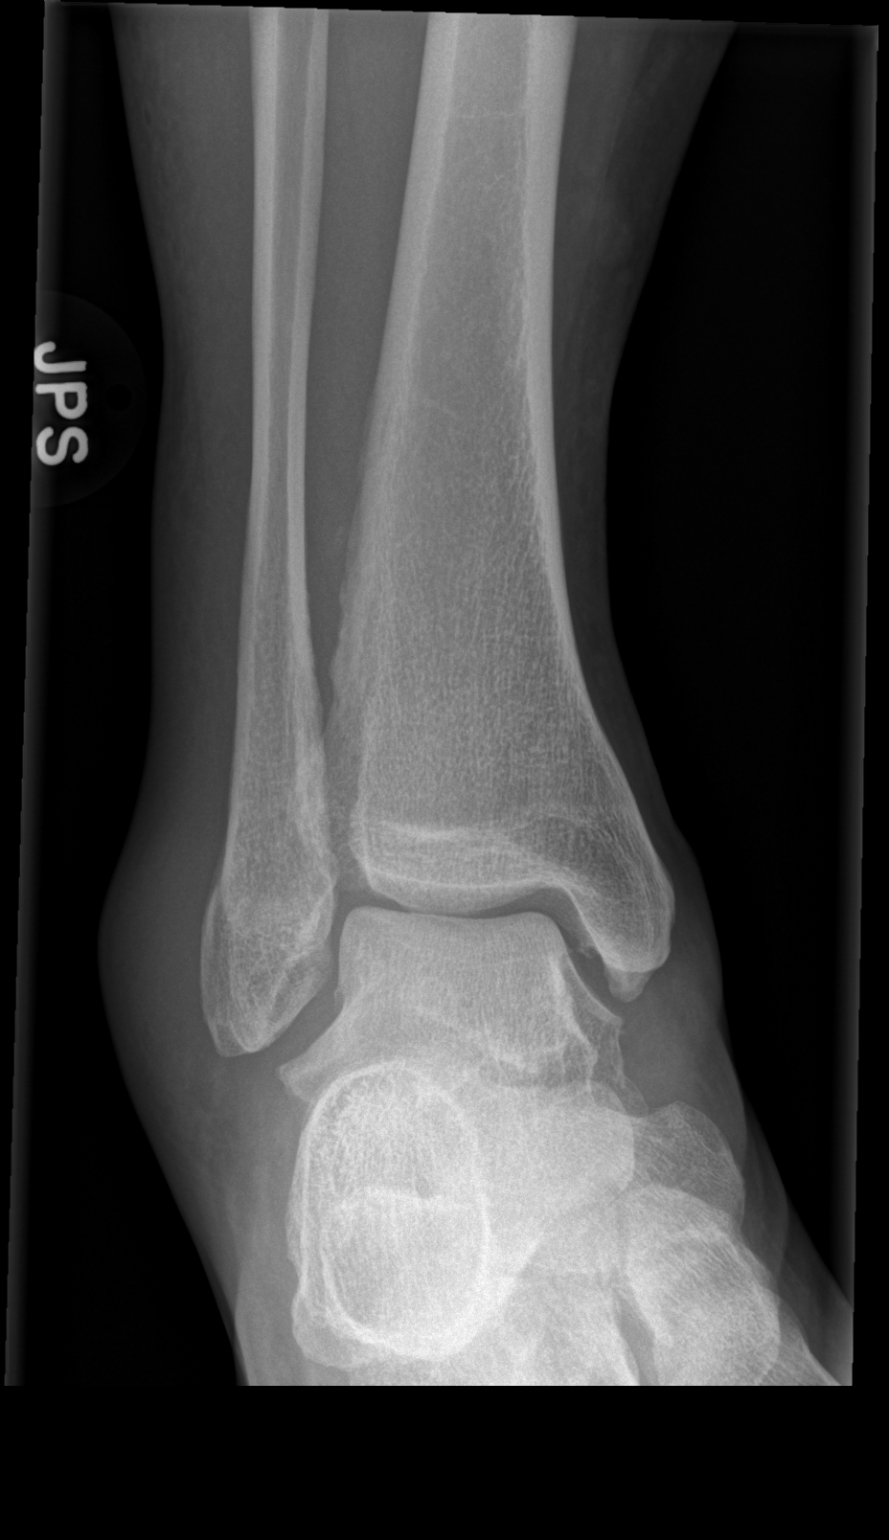

[x ankle obl right]
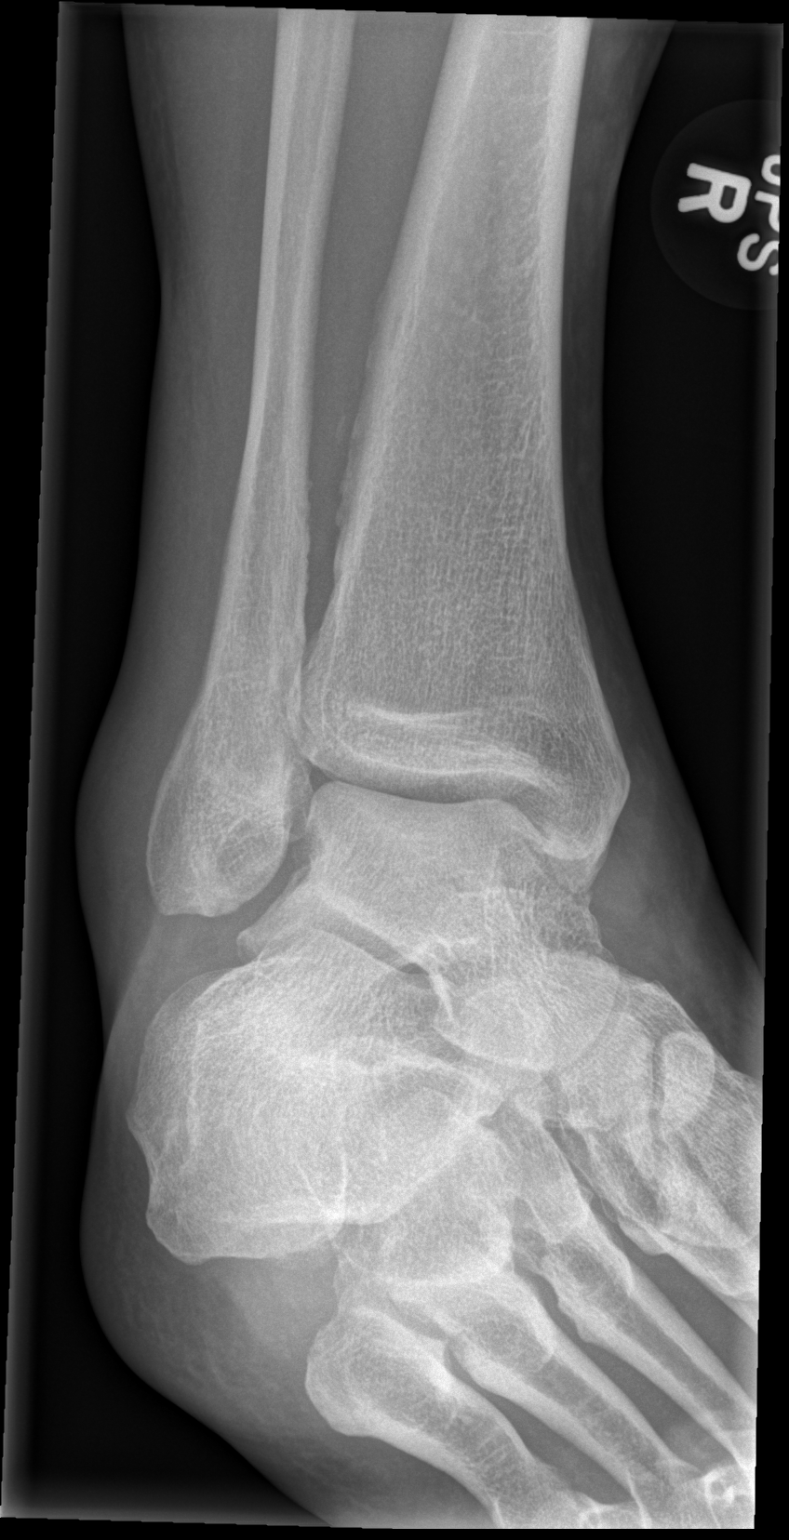

[x ankle lat right]
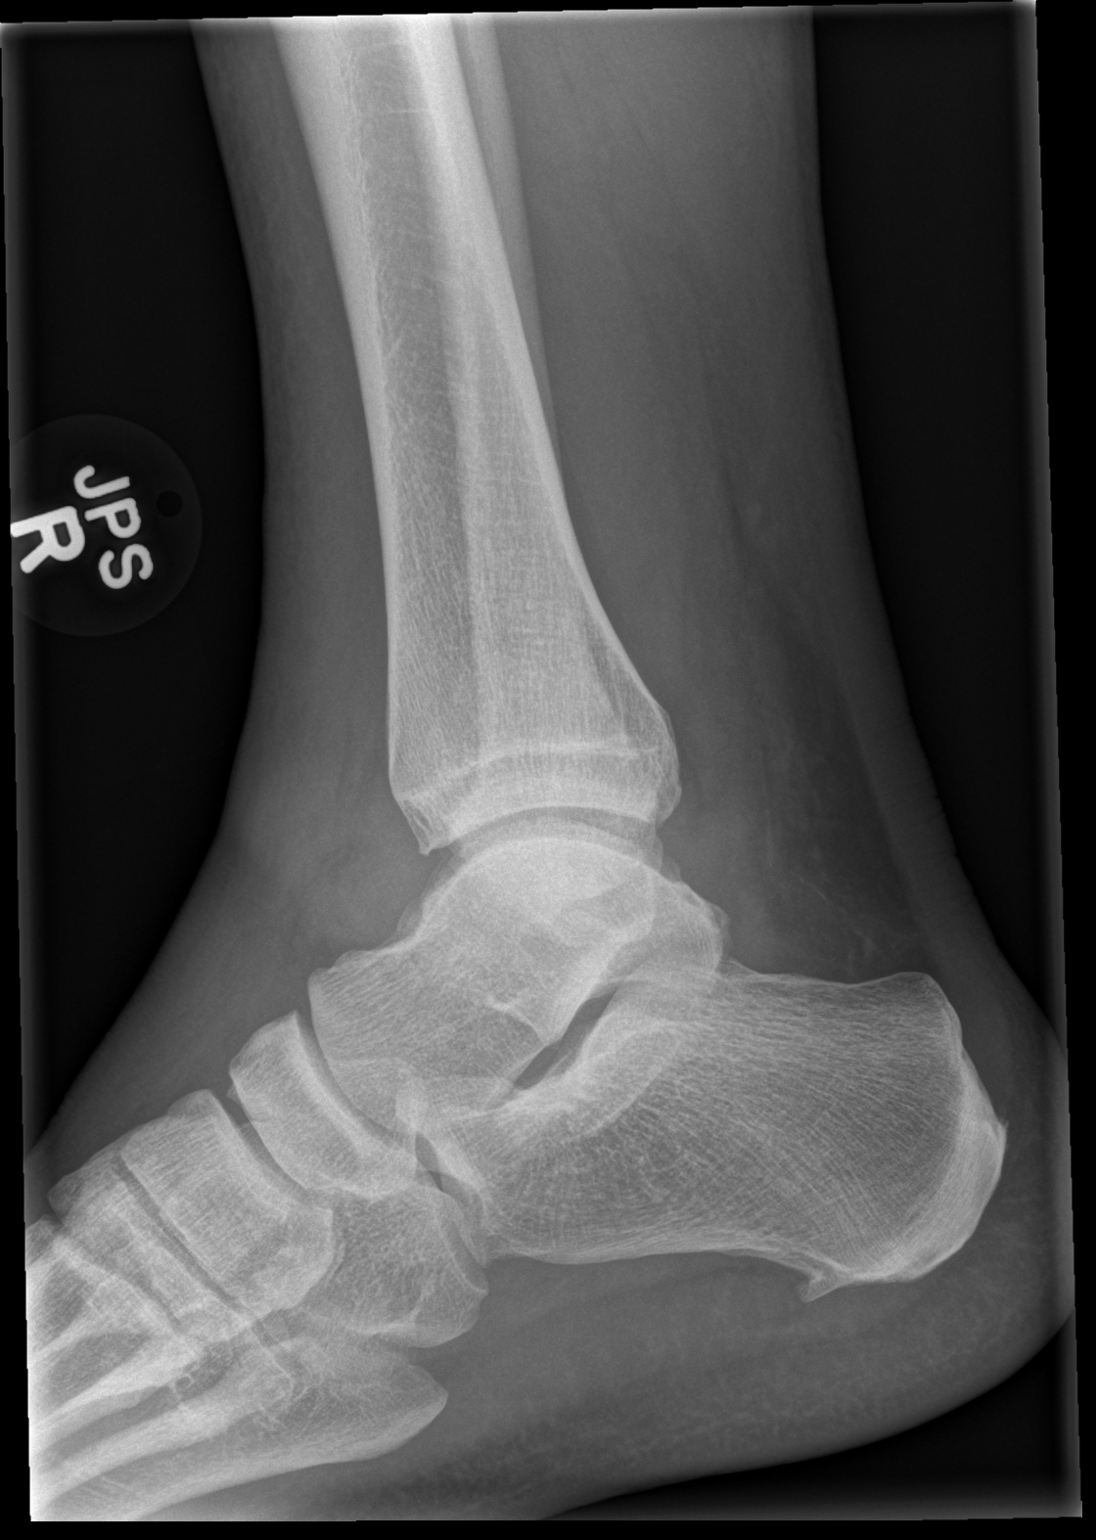

[3 of 3 positions shown; findings below may reference images not displayed]

FINDINGS: There is no evidence of fracture, dislocation, or joint effusion.
Mild marginal osteophyte along the anterior lip of the tibia. There
is a small ankle joint effusion. There is a plantar calcaneal spur.
There is soft tissue swelling over the lateral malleolus.
IMPRESSION: No acute osseous injury of the right ankle. Severe soft tissue
swelling over the lateral malleolus.

## 2021-09-15 ENCOUNTER — Other Ambulatory Visit: Payer: Self-pay

## 2021-09-15 ENCOUNTER — Ambulatory Visit
Admission: EM | Admit: 2021-09-15 | Discharge: 2021-09-15 | Disposition: A | Discharge status: Home / Self Care | Payer: No Typology Code available for payment source

## 2021-09-15 DIAGNOSIS — Z751 Person awaiting admission to adequate facility elsewhere: Secondary | ICD-10-CM

## 2021-09-15 NOTE — ED Notes (Signed)
Patient is being discharged from the Urgent Care and sent to the Emergency Department via POA with family. Per L.Romero Liner, patient is in need of higher level of care due to need for further evaluation. Patient is aware and verbalizes understanding of plan of care.  Vitals:   09/15/21 1520  BP: (!) 152/97  Pulse: 76  Resp: 20  Temp: 97.8 F (36.6 C)  SpO2: 96%

## 2021-09-15 NOTE — ED Triage Notes (Addendum)
Pt reports having irritation to right eye with with some blurriness. Pt reports having an infection to right upper tooth that was pulled, changes to eye began afterward.  Started: Last Monday

## 2021-09-15 NOTE — ED Provider Notes (Signed)
No services provided.  Patient was redirected to the emergency room given 1 week history of worsening eye pain, recent dental extraction right upper jaw, scleral injection, photophobia and changes.   Lynden Oxford Scales, PA-C 09/15/21 1539

## 2021-09-16 ENCOUNTER — Ambulatory Visit: Payer: Self-pay

## 2021-09-17 ENCOUNTER — Emergency Department (HOSPITAL_BASED_OUTPATIENT_CLINIC_OR_DEPARTMENT_OTHER)
Admission: EM | Admit: 2021-09-17 | Discharge: 2021-09-17 | Disposition: A | Discharge status: Home / Self Care | Payer: No Typology Code available for payment source | Attending: Emergency Medicine | Admitting: Emergency Medicine

## 2021-09-17 ENCOUNTER — Encounter (HOSPITAL_BASED_OUTPATIENT_CLINIC_OR_DEPARTMENT_OTHER): Payer: Self-pay

## 2021-09-17 ENCOUNTER — Other Ambulatory Visit: Payer: Self-pay

## 2021-09-17 DIAGNOSIS — H5711 Ocular pain, right eye: Secondary | ICD-10-CM | POA: Diagnosis present

## 2021-09-17 DIAGNOSIS — S0501XA Injury of conjunctiva and corneal abrasion without foreign body, right eye, initial encounter: Secondary | ICD-10-CM

## 2021-09-17 DIAGNOSIS — K047 Periapical abscess without sinus: Secondary | ICD-10-CM

## 2021-09-17 MED ORDER — ERYTHROMYCIN 5 MG/GM OP OINT
TOPICAL_OINTMENT | Freq: Once | OPHTHALMIC | Status: DC
  Filled 2021-09-17: qty 3.5

## 2021-09-17 MED ORDER — TETRACAINE HCL 0.5 % OP SOLN
2.0000 [drp] | Freq: Once | OPHTHALMIC | Status: DC
  Filled 2021-09-17: qty 4

## 2021-09-17 MED ORDER — AMOXICILLIN-POT CLAVULANATE 875-125 MG PO TABS: 1.0000 | ORAL_TABLET | Freq: Two times a day (BID) | ORAL | 0 refills | Status: AC

## 2021-09-17 MED ORDER — SULFAMETHOXAZOLE-TRIMETHOPRIM 800-160 MG PO TABS: 1.0000 | ORAL_TABLET | Freq: Two times a day (BID) | ORAL | 0 refills | Status: AC

## 2021-09-17 MED ORDER — FLUORESCEIN SODIUM 1 MG OP STRP
1.0000 | ORAL_STRIP | Freq: Once | OPHTHALMIC | Status: DC
  Filled 2021-09-17: qty 1

## 2021-09-17 NOTE — ED Notes (Signed)
Pt refusing blood work until he sees the provider.

## 2021-09-17 NOTE — ED Triage Notes (Signed)
Patient here POV from UC for  Patient had an Infected Tooth extracted approximately 2 weeks PTA. Patient here today due to Redness of Right Eye that has been present for approximately 1 week that has not subsided.  Redness to Same. No Fevers. No N/V/D.  NAD Noted during Triage. A&Ox4. GCS 15. Ambulatory.

## 2021-09-17 NOTE — ED Provider Notes (Signed)
[facility] EMERGENCY DEPT Provider Note   CSN: [id number] Arrival date & time: 09/17/21  1117     History  Chief Complaint  Patient presents with   Eye Problem    Stephen Henry is a 61 y.o. male.   Eye Problem Associated symptoms: redness    Patient is a 61 year old male presenting with right eye erythema and pain.  Patient had a dental abscess 2 weeks ago, was on amoxicillin and had an extraction of the infected tooth to the right upper jaw.  No complications after that until 1 week later he noticed erythema to the right eye.  Is been watery in the morning but no purulent discharge.  He has pain with eye movements and photophobia.  Denies any fevers at home, having a headache behind the eye.  Notices some swelling below the eyelid, has been taking Motrin like candy which has not improved the pain.  Patient does not currently wear contacts or glasses, denies any foreign body or injury to the eye.  He is a Curator and works on cars.  Home Medications Prior to Admission medications   Medication Sig Start Date End Date Taking? Authorizing Provider  ALPRAZolam Prudy Feeler) 1 MG tablet Take 1 tablet (1 mg total) by mouth 3 (three) times daily as needed for anxiety. 09/29/14   Sheliah Hatch, MD  HYDROcodone-acetaminophen (NORCO/VICODIN) 5-325 MG per tablet Take 1-2 tablets by mouth every 6 (six) hours as needed. 11/30/14   Teressa Lower, NP  ibuprofen (ADVIL,MOTRIN) 600 MG tablet Take 1 tablet (600 mg total) by mouth every 8 (eight) hours as needed for pain. 06/27/13   Sheliah Hatch, MD  meclizine (ANTIVERT) 50 MG tablet Take 1 tablet (50 mg total) by mouth 3 (three) times daily as needed. 02/10/14   Sheliah Hatch, MD      Allergies    Patient has no known allergies.    Review of Systems   Review of Systems  Eyes:  Positive for redness.   Physical Exam Updated Vital Signs BP (!) 157/106    Pulse 71    Temp (!) 97.1 F (36.2 C)    Resp 16    Ht 5\' 10"   (1.778 m)    Wt 87.1 kg    SpO2 99%    BMI 27.55 kg/m  Physical Exam Vitals and nursing note reviewed. Exam conducted with a chaperone present.  Constitutional:      General: He is not in acute distress.    Appearance: Normal appearance.  HENT:     Head: Normocephalic and atraumatic.     Comments: Mild lower eyelid swelling to right eye.     Mouth/Throat:     Comments: Poor dentition with evidence of previous dental procedures.  Right upper jaw tooth with  erythema and surrounding exudate. Not fluctuant.  Uvula is midline, no sublingual swelling or tenderness.  No trismus Eyes:     General: No scleral icterus.       Right eye: Discharge present.     Extraocular Movements: Extraocular movements intact.     Pupils: Pupils are equal, round, and reactive to light.     Comments: Pupils PERRLA, EOMI.  No nystagmus but pain with left and rightward gaze of the right eye.  Conjunctive is injected.  With fluorescein dye there is a slight corneal abrasion noted to the right at about 7:00.  Does not cover the pupil.  Skin:    Coloration: Skin is not jaundiced.  Neurological:  Mental Status: He is alert. Mental status is at baseline.     Coordination: Coordination normal.   ED Results / Procedures / Treatments   Labs (all labs ordered are listed, but only abnormal results are displayed) Labs Reviewed  BASIC METABOLIC PANEL  CBC WITH DIFFERENTIAL/PLATELET    EKG None  Radiology No results found.  Procedures Procedures    Medications Ordered in ED Medications  fluorescein ophthalmic strip 1 strip (has no administration in time range)  tetracaine (PONTOCAINE) 0.5 % ophthalmic solution 2 drop (has no administration in time range)    ED Course/ Medical Decision Making/ A&P                           Medical Decision Making Amount and/or Complexity of Data Reviewed Independent Historian: spouse External Data Reviewed: notes.    Details: Prior urgent care visit Labs:  ordered. Radiology: ordered.  Risk Prescription drug management. Decision regarding hospitalization.   Is a 62 year old male presenting with right conjunctival injection.  He is having pain with eye movement and recent history of dental infection is concerning for possible preseptal or orbital cellulitis.  Did review the patient's visit from urgent care earlier today.  Patient vision acuity is decreased, EOMI with reactive pupil.  On exam she does have corneal abrasion which is likely contributory to his symptoms.  Will give erythromycin ointment.  Given the recent dental infection and pain with EOM I do think patient would benefit from evaluation for preseptal cellulitis versus orbital cellulitis.    Discussed this with the patient, he is very upset at being sent to the ED.  He feels he should have been evaluated at urgent care and does not think he needs should have been sent to the emergency department.  We discussed that the amoxicillin without clavulanic acid likely is not sufficient coverage for the dental abscess.  We also discussed concern for preseptal and orbital cellulitis as well as risk of and for further treatment including blindness, worsening infection, poor outcomes, hospitalization or death.  Patient is aware this but does not desire to have any CT done at this time.  He states he would prefer to be treated empirically with antibiotics and to follow-up with his ophthalmologist next week.  Although I would prefer to proceed with CT I do think this is reasonable at this time.  We discussed very strict return precautions and he verbalized agreement.  Will discharge patient with erythromycin ointment, Augmentin to cover for the dental abscess and Bactrim to provide full coverage for preseptal cellulitis.  Patient was discharged in stable condition.         Final Clinical Impression(s) / ED Diagnoses Final diagnoses:  None    Rx / DC Orders ED Discharge Orders     None          Theron Arista, Cordelia Poche 09/17/21 2327    Ernie Avena, MD 09/18/21 [phone]

## 2021-09-17 NOTE — Discharge Instructions (Addendum)
Use the erythromycin ointment twice daily for the next week.  This is for the corneal abgrasion. Follow-up with your ophthalmologist next week on Monday or Tuesday if able. You need to take Augmentin twice daily for 7 days.  Take Bactrim twice daily for 7 days as well.  These 2 antibiotics will cover for a suspected preseptal/orbital cellulitis (infection behind your eye) as well as for the abscess in your mouth.  As we discussed, you need to return to the ED if you have vision loss, worsening pain, worsening swelling, difficulty swallowing or general decline.

## 2021-09-17 NOTE — ED Notes (Signed)
After obtaining visual acuity screening, pt states that he would not like to "have all these tests done" since he believes that he has conjunctivitis.

## 2021-10-27 ENCOUNTER — Ambulatory Visit (HOSPITAL_COMMUNITY)
Admission: EM | Admit: 2021-10-27 | Discharge: 2021-10-27 | Disposition: A | Discharge status: Home / Self Care | Payer: No Typology Code available for payment source

## 2021-10-27 DIAGNOSIS — Z046 Encounter for general psychiatric examination, requested by authority: Secondary | ICD-10-CM | POA: Diagnosis not present

## 2021-10-27 NOTE — Progress Notes (Signed)
Hoy was presented with his AVS, questions answered and he was escorted to the lobby without incident. ?

## 2021-10-27 NOTE — Discharge Instructions (Signed)
Discharge recommendations:  Please see information for follow-up appointment with psychiatry and therapy. Please follow up with your primary care provider for all medical related needs.   Therapy: We recommend that patient participate in individual therapy to address mental health concerns.  Safety:  The patient should abstain from use of illicit substances/drugs and abuse of any medications. If symptoms worsen or do not continue to improve or if the patient becomes actively suicidal or homicidal then it is recommended that the patient return to the closest hospital emergency department, the [facility], or call 911 for further evaluation and treatment. National Suicide Prevention Lifeline 1-800-SUICIDE or 1-800-273-8255.  About 988 988 offers 24/7 access to trained crisis counselors who can help people experiencing mental health-related distress. People can call or text 988 or chat 988lifeline.org for themselves or if they are worried about a loved one who may need crisis support.   Please contact one of the following facilities to start medication management and therapy services:    [facility] [address]  [place], [place] [address] [phone]   [facility]  [address] [place], [place] [address] [phone]  [facility]  [address], [place], [place] [address] [phone]  [facility]  [address]  [place], [place] [address] [phone]  [facility]  [address] [place], [place] [address] [phone]  [facility]  [address],  ,  [address] [phone]      
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
## 2021-11-07 ENCOUNTER — Telehealth (HOSPITAL_COMMUNITY): Payer: Self-pay

## 2021-11-07 NOTE — BH Assessment (Signed)
Care Management - Follow Up Deputy Discharges  ? ?Writer made contact with the patient. Patient reports that he does not need to follow up with a mental health or substance abuse therapist or psychiatrist.   ?
